# Patient Record
Sex: Male | Born: 1965 | Race: White | Hispanic: No | Marital: Married | Smoking: Never smoker
Health system: Southern US, Community
[De-identification: ages and names within clinical notes are randomized; demographics above are authoritative.]

## PROBLEM LIST (undated history)

## (undated) DIAGNOSIS — R079 Chest pain, unspecified: Secondary | ICD-10-CM

## (undated) DIAGNOSIS — F419 Anxiety disorder, unspecified: Secondary | ICD-10-CM

## (undated) HISTORY — DX: Chest pain, unspecified: R07.9

## (undated) HISTORY — DX: Anxiety disorder, unspecified: F41.9

## (undated) HISTORY — PX: OTHER SURGICAL HISTORY: SHX169

---

## 2017-03-06 ENCOUNTER — Ambulatory Visit: Payer: Self-pay | Admitting: Cardiology

## 2017-03-11 ENCOUNTER — Ambulatory Visit: Payer: Self-pay | Admitting: Cardiology

## 2017-03-12 ENCOUNTER — Encounter: Payer: Self-pay | Admitting: *Deleted

## 2017-03-13 ENCOUNTER — Encounter: Payer: Self-pay | Admitting: *Deleted

## 2017-03-13 ENCOUNTER — Telehealth: Payer: Self-pay | Admitting: Cardiovascular Disease

## 2017-03-13 ENCOUNTER — Other Ambulatory Visit: Payer: Self-pay

## 2017-03-13 ENCOUNTER — Ambulatory Visit (INDEPENDENT_AMBULATORY_CARE_PROVIDER_SITE_OTHER): Payer: PRIVATE HEALTH INSURANCE | Admitting: Cardiovascular Disease

## 2017-03-13 ENCOUNTER — Encounter: Payer: Self-pay | Admitting: Cardiovascular Disease

## 2017-03-13 VITALS — BP 142/84 | HR 91 | Ht 70.0 in | Wt 242.0 lb

## 2017-03-13 DIAGNOSIS — R0789 Other chest pain: Secondary | ICD-10-CM | POA: Diagnosis not present

## 2017-03-13 DIAGNOSIS — R030 Elevated blood-pressure reading, without diagnosis of hypertension: Secondary | ICD-10-CM

## 2017-03-13 DIAGNOSIS — R5383 Other fatigue: Secondary | ICD-10-CM

## 2017-03-13 DIAGNOSIS — R0609 Other forms of dyspnea: Secondary | ICD-10-CM

## 2017-03-13 DIAGNOSIS — Z9289 Personal history of other medical treatment: Secondary | ICD-10-CM | POA: Diagnosis not present

## 2017-03-13 NOTE — Progress Notes (Signed)
CARDIOLOGY CONSULT NOTE  Patient ID: Hayden Lewis MRN: [id number] DOB/AGE: November 19, 1965 52 y.o.  Admit date: (Not on file) Primary Physician: Patient, No Pcp Per Referring Physician: Dr. Romie MinusJohn Dallara  Reason for Consultation: Chest pain  HPI: Hayden Lewis is a 52 y.o. male who is being seen today for the evaluation of chest pain at the request of Dr. Romie MinusJohn Dallara.  He was evaluated in the ED in [place] on 02/11/17.  I reviewed all relevant documentation, labs, and studies.  He presented there with retrosternal chest pain.  CBC was normal.  Basic metabolic panel showed hypokalemia, potassium 3.3.  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  I personally reviewed an ECG performed on 02/11/17 which demonstrated sinus rhythm with no ischemic ST segment abnormalities, nor any arrhythmias.  There were nonspecific T wave abnormalities in the inferior leads and precordial leads with T wave inversions in leads III and aVF.  He apparently works at a call center and on the day of his ED presentation, he had been in the middle of a phone call and then experienced sharp left-sided chest pains which he described as "severe ".  He said he has a history of anxiety but the symptoms were very different.  Symptoms waxed and waned throughout the day.  A day later he was at [facility] [employer] and picked up some pamphlets which had fallen and he experienced palpitations and felt like his heart was racing.  He now has been expensing intermittent exertional chest discomfort as well as intermittent exertional dyspnea.  He denies leg swelling, lightheadedness, dizziness, orthopnea, paroxysmal nocturnal dyspnea, and syncope.  When he walks to his mailbox with a slight inclined driveway, he has some exertional dyspnea.  He denies a history of smoking.  Family history: Father developed CHF and hypertension in his early 8160s.    Allergies  Allergen Reactions  .  Penicillins     Current Outpatient Medications  Medication Sig Dispense Refill  . busPIRone (BUSPAR) 15 MG tablet Take 15 mg by mouth 2 (two) times daily.    Marland Kitchen. omeprazole (PRILOSEC OTC) 20 MG tablet Take 20 mg by mouth daily.     No current facility-administered medications for this visit.     Past Medical History:  Diagnosis Date  . Anxiety   . Chest pain     Past Surgical History:  Procedure Laterality Date  . EYE SX      Social History   Socioeconomic History  . Marital status: Married    Spouse name: Not on file  . Number of children: Not on file  . Years of education: Not on file  . Highest education level: Not on file  Social Needs  . Financial resource strain: Not on file  . Food insecurity - worry: Not on file  . Food insecurity - inability: Not on file  . Transportation needs - medical: Not on file  . Transportation needs - non-medical: Not on file  Occupational History  . Not on file  Tobacco Use  . Smoking status: Never Smoker  . Smokeless tobacco: Never Used  Substance and Sexual Activity  . Alcohol use: Not on file  . Drug use: Not on file  . Sexual activity: Not on file  Other Topics Concern  . Not on file  Social History Narrative  . Not on file      Current Meds  Medication Sig  . busPIRone (BUSPAR) 15  MG tablet Take 15 mg by mouth 2 (two) times daily.  Marland Kitchen omeprazole (PRILOSEC OTC) 20 MG tablet Take 20 mg by mouth daily.  . [DISCONTINUED] busPIRone (BUSPAR) 5 MG tablet Take 5 mg by mouth 3 (three) times daily.      Review of systems complete and found to be negative unless listed above in HPI    Physical exam Blood pressure (!) 142/84, pulse 91, height 5\' 10"  (1.778 m), weight 242 lb (109.8 kg), SpO2 95 %. General: NAD Neck: No JVD, no thyromegaly or thyroid nodule.  Lungs: Clear to auscultation bilaterally with normal respiratory effort. CV: Nondisplaced PMI. Regular rate and rhythm, normal S1/S2, no S3/S4, no murmur.  No  peripheral edema.  No carotid bruit.    Abdomen: Soft, nontender, no distention.  Skin: Intact without lesions or rashes.  Neurologic: Alert and oriented x 3.  Psych: Normal affect. Extremities: No clubbing or cyanosis.  HEENT: Normal.   ECG: Most recent ECG reviewed.   Labs: No results found for: K, BUN, CREATININE, ALT, TSH, HGB   Lipids: No results found for: LDLCALC, LDLDIRECT, CHOL, TRIG, HDL      ASSESSMENT AND PLAN:  1. Chest pain and exertional dyspnea/fatigue: Since his ED presentation, he has been experiencing intermittent exertional symptoms with associated fatigue.  ECG did demonstrate inferior T wave inversions.  I will obtain an exercise Myoview stress test to evaluate for ischemic heart disease.  I will obtain an echocardiogram to evaluate cardiac structure and function.  I will also request copies of labs such as lipids from his PCP.  2.  Elevated blood pressure: He does not carry a diagnosis of hypertension.  This will need continued monitoring.   Disposition: Follow up in 6 weeks.   Signed: Prentice Docker, M.D., F.A.C.C.  03/13/2017, 9:21 AM

## 2017-03-13 NOTE — Telephone Encounter (Signed)
Pre-cert Verification for the following procedure   Exercise Myoview -scheduled for 03/25/17 at [facility] scheduled for 04/03/17 at [facility]

## 2017-03-13 NOTE — Patient Instructions (Signed)
Medication Instructions:  Continue all current medications.  Labwork: none  Testing/Procedures:  Your physician has requested that you have an echocardiogram. Echocardiography is a painless test that uses sound waves to create images of your heart. It provides your doctor with information about the size and shape of your heart and how well your heart's chambers and valves are working. This procedure takes approximately one hour. There are no restrictions for this procedure.  Your physician has requested that you have en exercise stress myoview. For further information please visit www.[email or web address]. Please follow instruction sheet, as given.  Office will contact with results via phone or letter.    Follow-Up: 6 weeks   Any Other Special Instructions Will Be Listed Below (If Applicable).  If you need a refill on your cardiac medications before your next appointment, please call your pharmacy.  

## 2017-03-19 ENCOUNTER — Encounter: Payer: Self-pay | Admitting: *Deleted

## 2017-03-25 ENCOUNTER — Ambulatory Visit (HOSPITAL_COMMUNITY)
Admission: RE | Admit: 2017-03-25 | Discharge: 2017-03-25 | Disposition: A | Discharge status: Home / Self Care | Payer: Self-pay | Source: Ambulatory Visit | Attending: Cardiovascular Disease | Admitting: Cardiovascular Disease

## 2017-03-25 ENCOUNTER — Encounter (HOSPITAL_COMMUNITY)
Admission: RE | Admit: 2017-03-25 | Discharge: 2017-03-25 | Disposition: A | Discharge status: Home / Self Care | Payer: Self-pay | Source: Ambulatory Visit | Attending: Cardiovascular Disease | Admitting: Cardiovascular Disease

## 2017-03-25 ENCOUNTER — Encounter (HOSPITAL_COMMUNITY): Payer: Self-pay

## 2017-03-25 DIAGNOSIS — R0789 Other chest pain: Secondary | ICD-10-CM | POA: Insufficient documentation

## 2017-03-25 DIAGNOSIS — R0609 Other forms of dyspnea: Secondary | ICD-10-CM | POA: Insufficient documentation

## 2017-03-25 LAB — NM MYOCAR MULTI W/SPECT W/WALL MOTION / EF
CHL CUP NUCLEAR SRS: 0
CHL CUP NUCLEAR SSS: 1
CHL CUP RESTING HR STRESS: 69 {beats}/min
CSEPED: 2 min
CSEPEDS: 30 s
CSEPEW: 4.6 METS
LV dias vol: 77 mL (ref 62–150)
LVSYSVOL: 20 mL
MPHR: 168 {beats}/min
Peak HR: 162 {beats}/min
Percent HR: 96 %
RATE: 0.3
RPE: 15
SDS: 1
TID: 0.77

## 2017-03-25 MED ORDER — TECHNETIUM TC 99M TETROFOSMIN IV KIT
10.0000 | PACK | Freq: Once | INTRAVENOUS | Status: AC | PRN
Start: 2017-03-25 — End: 2017-03-25
  Administered 2017-03-25: 10.7 via INTRAVENOUS

## 2017-03-25 MED ORDER — SODIUM CHLORIDE 0.9% FLUSH
INTRAVENOUS | Status: AC
  Administered 2017-03-25: 10 mL via INTRAVENOUS
  Filled 2017-03-25: qty 10

## 2017-03-25 MED ORDER — TECHNETIUM TC 99M TETROFOSMIN IV KIT
30.0000 | PACK | Freq: Once | INTRAVENOUS | Status: AC | PRN
  Administered 2017-03-25: 32.2 via INTRAVENOUS

## 2017-03-25 MED ORDER — SODIUM CHLORIDE 0.9% FLUSH
INTRAVENOUS | Status: AC
  Filled 2017-03-25: qty 160

## 2017-03-25 MED ORDER — REGADENOSON 0.4 MG/5ML IV SOLN
INTRAVENOUS | Status: AC
  Filled 2017-03-25: qty 5

## 2017-04-03 ENCOUNTER — Other Ambulatory Visit: Payer: Self-pay

## 2017-04-03 ENCOUNTER — Ambulatory Visit (INDEPENDENT_AMBULATORY_CARE_PROVIDER_SITE_OTHER): Payer: PRIVATE HEALTH INSURANCE

## 2017-04-03 DIAGNOSIS — R0609 Other forms of dyspnea: Secondary | ICD-10-CM

## 2017-04-03 DIAGNOSIS — R0789 Other chest pain: Secondary | ICD-10-CM | POA: Diagnosis not present

## 2017-04-10 ENCOUNTER — Telehealth: Payer: Self-pay | Admitting: *Deleted

## 2017-04-10 NOTE — Telephone Encounter (Signed)
No pmd listed in chart.   Notes recorded by Lesle ChrisHill, Angela G, LPN on 0/45/40983/21/2019 at 4:56 PM EDT Patient notified. Copy to pmd. Follow up scheduled for 06/05/2017 with Dr. Purvis SheffieldKoneswaran.   ------  Notes recorded by Laqueta LindenKoneswaran, Suresh A, MD on 04/03/2017 at 5:25 PM EDT Normal pumping function. Some stiffness with relaxation. Needs blood pressure control and weight loss to improve cardiac efficiency. Will discuss further at follow up visit.

## 2017-05-06 ENCOUNTER — Encounter: Payer: Self-pay | Admitting: Cardiovascular Disease

## 2017-05-06 ENCOUNTER — Ambulatory Visit (INDEPENDENT_AMBULATORY_CARE_PROVIDER_SITE_OTHER): Payer: PRIVATE HEALTH INSURANCE | Admitting: Cardiovascular Disease

## 2017-05-06 ENCOUNTER — Other Ambulatory Visit: Payer: Self-pay

## 2017-05-06 VITALS — BP 132/84 | HR 58 | Ht 70.0 in | Wt 242.0 lb

## 2017-05-06 DIAGNOSIS — R5383 Other fatigue: Secondary | ICD-10-CM

## 2017-05-06 DIAGNOSIS — R0609 Other forms of dyspnea: Secondary | ICD-10-CM | POA: Diagnosis not present

## 2017-05-06 DIAGNOSIS — R030 Elevated blood-pressure reading, without diagnosis of hypertension: Secondary | ICD-10-CM

## 2017-05-06 DIAGNOSIS — R0789 Other chest pain: Secondary | ICD-10-CM

## 2017-05-06 NOTE — Progress Notes (Signed)
SUBJECTIVE: The patient returns for follow-up after undergoing cardiovascular testing performed for the evaluation of chest pain.  He underwent a low risk nuclear stress test on 03/25/17.  Myocardial perfusion was normal.  He had diminished exercise tolerance.  Blood pressure demonstrated a hypertensive response to exercise.  Echocardiogram demonstrated vigorous left ventricular systolic function and normal regional wall motion, LVEF 65-70%, mild concentric and moderate focal basal septal hypertrophy, and grade 2 diastolic dysfunction.  He is here with his wife.  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  He has been eating healthier and is trying to exercise more in the form of walking.  He and his wife both take a supplement which is apparently plant-based called Plexus which he said makes him feel much better.    Review of Systems: As per "subjective", otherwise negative.  Allergies  Allergen Reactions  . Penicillins     Current Outpatient Medications  Medication Sig Dispense Refill  . busPIRone (BUSPAR) 15 MG tablet Take 15 mg by mouth 2 (two) times daily.    Marland Kitchen omeprazole (PRILOSEC OTC) 20 MG tablet Take 20 mg by mouth daily.     No current facility-administered medications for this visit.     Past Medical History:  Diagnosis Date  . Anxiety   . Chest pain     Past Surgical History:  Procedure Laterality Date  . EYE SX      Social History   Socioeconomic History  . Marital status: Married    Spouse name: Not on file  . Number of children: Not on file  . Years of education: Not on file  . Highest education level: Not on file  Occupational History  . Not on file  Social Needs  . Financial resource strain: Not on file  . Food insecurity:    Worry: Not on file    Inability: Not on file  . Transportation needs:    Medical: Not on file    Non-medical: Not on file  Tobacco  Use  . Smoking status: Never Smoker  . Smokeless tobacco: Never Used  Substance and Sexual Activity  . Alcohol use: Not on file  . Drug use: Not on file  . Sexual activity: Not on file  Lifestyle  . Physical activity:    Days per week: Not on file    Minutes per session: Not on file  . Stress: Not on file  Relationships  . Social connections:    Talks on phone: Not on file    Gets together: Not on file    Attends religious service: Not on file    Active member of club or organization: Not on file    Attends meetings of clubs or organizations: Not on file    Relationship status: Not on file  . Intimate partner violence:    Fear of current or ex partner: Not on file    Emotionally abused: Not on file    Physically abused: Not on file    Forced sexual activity: Not on file  Other Topics Concern  . Not on file  Social History Narrative  . Not on file     Vitals:   05/06/17 1609  BP: 132/84  Pulse: (!) 58  SpO2: 97%  Weight: 242 lb (109.8 kg)  Height: 5\' 10"  (1.778 m)    Wt Readings from Last 3  Encounters:  05/06/17 242 lb (109.8 kg)  03/13/17 242 lb (109.8 kg)     PHYSICAL EXAM General: NAD HEENT: Normal. Neck: No JVD, no thyromegaly. Lungs: Clear to auscultation bilaterally with normal respiratory effort. CV: Regular rate and rhythm, normal S1/S2, no S3/S4, no murmur. No pretibial or periankle edema.  No carotid bruit.   Abdomen: Soft, nontender, no distention.  Neurologic: Alert and oriented.  Psych: Normal affect. Skin: Normal. Musculoskeletal: No gross deformities.    ECG: Most recent ECG reviewed.   Labs: No results found for: K, BUN, CREATININE, ALT, TSH, HGB   Lipids: No results found for: LDLCALC, LDLDIRECT, CHOL, TRIG, HDL     ASSESSMENT AND PLAN:  1. Chest pain and exertional dyspnea/fatigue:  Symptomatically improved after quitting his job which apparently gave him a lot of stress.  Nuclear stress test was low risk as detailed above.   Left ventricular systolic function and regional wall motion were normal.  No additional cardiac testing is indicated at this time.  2.  Elevated BP: Blood pressure is normal today.  Stress test results noted above with a hypertensive response to exercise.  Echocardiogram also demonstrated grade 2 diastolic dysfunction and LVH, indicative of hypertensive heart disease.  This will need continued monitoring.  He is currently euvolemic.     Disposition: Follow up as needed   Prentice DockerSuresh Koneswaran, M.D., F.A.C.C.

## 2017-05-06 NOTE — Patient Instructions (Signed)
Medication Instructions:  Your physician recommends that you continue on your current medications as directed. Please refer to the Current Medication list given to you today.  Labwork: NONE  Testing/Procedures: NONE  Follow-Up: Your physician recommends that you schedule a follow-up appointment AS NEEDED WITH DR. KONESWARAN  Any Other Special Instructions Will Be Listed Below (If Applicable).  If you need a refill on your cardiac medications before your next appointment, please call your pharmacy. 

## 2018-08-12 IMAGING — NM NM MYOCAR MULTI W/SPECT W/WALL MOTION & EF
2 series · 12 of 12 positions shown · non-contrast
Comparison: none

[Series 1: rest · 6.51mm/px · 6 of 64 frames shown]
[frame 6/64]
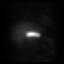
[frame 16/64]
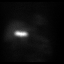
[frame 27/64]
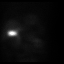
[frame 38/64]
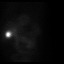
[frame 48/64]
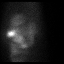
[frame 59/64]
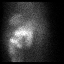

[Series 3: stress gated - perfusion · 6.51mm/px · 6 of 64 frames shown]
[frame 6/64]
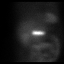
[frame 16/64]
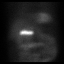
[frame 27/64]
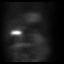
[frame 38/64]
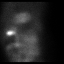
[frame 48/64]
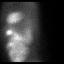
[frame 59/64]
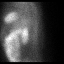

[12 of 12 positions shown; findings below may reference images not displayed]

Canned report from images found in remote index.

Refer to host system for actual result text.
